# Patient Record
Sex: Female | Born: 1985 | Race: Black or African American | Hispanic: No | Marital: Single | Smoking: Never smoker
Health system: Southern US, Community
[De-identification: ages and names within clinical notes are randomized; demographics above are authoritative.]

---

## 2015-04-25 ENCOUNTER — Emergency Department (HOSPITAL_BASED_OUTPATIENT_CLINIC_OR_DEPARTMENT_OTHER)
Admission: EM | Admit: 2015-04-25 | Discharge: 2015-04-25 | Disposition: A | Discharge status: Home / Self Care | Payer: Self-pay | Attending: Emergency Medicine | Admitting: Emergency Medicine

## 2015-04-25 ENCOUNTER — Encounter (HOSPITAL_BASED_OUTPATIENT_CLINIC_OR_DEPARTMENT_OTHER): Payer: Self-pay

## 2015-04-25 DIAGNOSIS — K088 Other specified disorders of teeth and supporting structures: Secondary | ICD-10-CM | POA: Insufficient documentation

## 2015-04-25 DIAGNOSIS — K0889 Other specified disorders of teeth and supporting structures: Secondary | ICD-10-CM

## 2015-04-25 DIAGNOSIS — M545 Low back pain: Secondary | ICD-10-CM | POA: Insufficient documentation

## 2015-04-25 MED ORDER — NAPROXEN 500 MG PO TABS: 500.0000 mg | ORAL_TABLET | Freq: Two times a day (BID) | ORAL | Status: DC

## 2015-04-25 NOTE — ED Notes (Signed)
Pt c/o left back tooth pain radiating down left neck to left shoulder blade.  Pain so severe in her back that she is unable to walk.

## 2015-04-25 NOTE — ED Provider Notes (Signed)
CSN: [id number]     Arrival date & time 04/25/15  1208 History   First MD Initiated Contact with Patient 04/25/15 1249     Chief Complaint  Patient presents with  . Back Pain  . Dental Pain     (Consider location/radiation/quality/duration/timing/severity/associated sxs/prior Treatment) HPI Comments: Patient presents today with left lower dental pain and also lower back pain.  He states that onset of both symptoms earlier this morning.  Both the dental pain and the back pain have been intermittent.  She denies any pain at this time.  She did not take anything for pain prior to arrival.  Back pain does not radiate.  Pain was worse with movement..  Dental pain is left lower tooth in the area of the wisdom tooth.  No acute injury or trauma to the teeth or the back.  She denies numbness, tingling, bowel/bladder incontinence, fever, chills, abdominal pain, or difficulty swallowing.  She does not have a dentist.  Patient is a 29 y.o. female presenting with back pain and tooth pain. The history is provided by the patient.  Back Pain Dental Pain   History reviewed. No pertinent past medical history. History reviewed. No pertinent past surgical history. No family history on file. History  Substance Use Topics  . Smoking status: Never Smoker   . Smokeless tobacco: Not on file  . Alcohol Use: Not on file   OB History    No data available     Review of Systems  Musculoskeletal: Positive for back pain.  All other systems reviewed and are negative.     Allergies  Review of patient's allergies indicates no known allergies.  Home Medications   Prior to Admission medications   Not on File   BP 123/70 mmHg  Pulse 93  Temp(Src) 97.6 F (36.4 C) (Oral)  Resp 18  SpO2 100% Physical Exam  Constitutional: She appears well-developed and well-nourished.  HENT:  Head: Normocephalic and atraumatic.  Mouth/Throat: Oropharynx is clear and moist and mucous membranes are normal. No trismus  in the jaw. No dental abscesses or dental caries.  No tenderness to palpation of the gingiva.  No sublingual tenderness or swelling.    Neck: Normal range of motion. Neck supple.  Cardiovascular: Normal rate, regular rhythm and normal heart sounds.   Pulmonary/Chest: Effort normal and breath sounds normal.  Abdominal: Soft. There is no tenderness.  Musculoskeletal:       Thoracic back: She exhibits normal range of motion, no tenderness, no bony tenderness, no swelling, no edema and no deformity.       Lumbar back: She exhibits normal range of motion, no tenderness, no bony tenderness, no swelling, no edema and no deformity.  Neurological: She is alert. She has normal strength. No sensory deficit. Gait normal.  Skin: Skin is warm and dry.  Psychiatric: She has a normal mood and affect.  Nursing note and vitals reviewed.   ED Course  Procedures (including critical care time) Labs Review Labs Reviewed - No data to display  Imaging Review No results found.   EKG Interpretation   Date/Time:  Saturday April 25 2015 12:36:01 EDT Ventricular Rate:  94 PR Interval:  122 QRS Duration: 86 QT Interval:  356 QTC Calculation: 445 R Axis:   78 Text Interpretation:  Normal sinus rhythm with sinus arrhythmia  Nonspecific T wave abnormality Abnormal ECG agree. no STEMI Confirmed by  Donnald Garre, MD, Lebron Conners [phone]) on 04/25/2015 12:50:46 PM      MDM   Final  diagnoses:  None   Patient presents today with complaints of lower back pain and also dental pain.  However, at the time of evaluation she denied any pain at all.  She had not taken anything for pain prior to arrival.  She reports that the pain has been intermittent throughout the day.   No gross dental abscess.  Exam unconcerning for Ludwig's angina or spread of infection.   Urged patient to follow-up with dentist.  No neurological deficits and normal neuro exam.  Patient can ambulate without difficulty.  No loss of bowel or bladder control.   No concern for cauda equina.  No fever, night sweats, weight loss, h/o cancer, IVDU.  Patient stale for discharge.  Return precautions given.      Santiago Glad, PA-C 04/27/15 1610  Arby Barrette, MD 04/27/15 [phone]

## 2016-05-18 ENCOUNTER — Ambulatory Visit: Payer: Self-pay

## 2016-06-08 ENCOUNTER — Telehealth: Payer: Self-pay | Admitting: Family

## 2016-06-08 NOTE — Telephone Encounter (Signed)
Got patient scheduled for this Friday.

## 2016-06-08 NOTE — Telephone Encounter (Signed)
Ok to schedule in a 30 minute slot.

## 2016-06-08 NOTE — Telephone Encounter (Signed)
Patient needs to establish care and have CPE before 8/28th.  Patient is requesting to be worked in soon because she is needing a form completed for school.  Is this something Tammy SoursGreg can do?  Note to scheduler: pt is self pay

## 2016-06-10 ENCOUNTER — Ambulatory Visit: Payer: Self-pay | Admitting: Family

## 2016-06-10 DIAGNOSIS — Z7689 Persons encountering health services in other specified circumstances: Secondary | ICD-10-CM

## 2018-11-18 ENCOUNTER — Emergency Department (HOSPITAL_COMMUNITY)
Admission: EM | Admit: 2018-11-18 | Discharge: 2018-11-18 | Disposition: A | Discharge status: Home / Self Care | Payer: Self-pay | Attending: Emergency Medicine | Admitting: Emergency Medicine

## 2018-11-18 ENCOUNTER — Emergency Department (HOSPITAL_COMMUNITY): Payer: Self-pay

## 2018-11-18 ENCOUNTER — Encounter (HOSPITAL_COMMUNITY): Payer: Self-pay | Admitting: Emergency Medicine

## 2018-11-18 DIAGNOSIS — R1031 Right lower quadrant pain: Secondary | ICD-10-CM | POA: Insufficient documentation

## 2018-11-18 DIAGNOSIS — R109 Unspecified abdominal pain: Secondary | ICD-10-CM

## 2018-11-18 DIAGNOSIS — N132 Hydronephrosis with renal and ureteral calculous obstruction: Secondary | ICD-10-CM | POA: Insufficient documentation

## 2018-11-18 DIAGNOSIS — N200 Calculus of kidney: Secondary | ICD-10-CM

## 2018-11-18 LAB — URINALYSIS, ROUTINE W REFLEX MICROSCOPIC
Bilirubin Urine: NEGATIVE
GLUCOSE, UA: 50 mg/dL — AB
Hgb urine dipstick: NEGATIVE
Ketones, ur: 80 mg/dL — AB
Nitrite: NEGATIVE
PH: 8 (ref 5.0–8.0)
Protein, ur: 30 mg/dL — AB
Specific Gravity, Urine: 1.019 (ref 1.005–1.030)

## 2018-11-18 LAB — POC URINE PREG, ED: Preg Test, Ur: NEGATIVE

## 2018-11-18 MED ORDER — HYDROMORPHONE HCL 1 MG/ML IJ SOLN: 0.5000 mg | Freq: Once | INTRAMUSCULAR | Status: DC

## 2018-11-18 MED ORDER — ONDANSETRON 4 MG PO TBDP: 4.0000 mg | ORAL_TABLET | Freq: Three times a day (TID) | ORAL | 0 refills | Status: AC | PRN

## 2018-11-18 MED ORDER — MORPHINE SULFATE (PF) 4 MG/ML IV SOLN
4.0000 mg | Freq: Once | INTRAVENOUS | Status: DC
Start: 2018-11-18 — End: 2018-11-18

## 2018-11-18 MED ORDER — ONDANSETRON 4 MG PO TBDP
4.0000 mg | ORAL_TABLET | Freq: Once | ORAL | Status: AC
  Administered 2018-11-18: 4 mg via ORAL
  Filled 2018-11-18: qty 1

## 2018-11-18 MED ORDER — OXYCODONE-ACETAMINOPHEN 5-325 MG PO TABS
1.0000 | ORAL_TABLET | Freq: Once | ORAL | Status: AC
  Administered 2018-11-18: 1 via ORAL
  Filled 2018-11-18: qty 1

## 2018-11-18 MED ORDER — NAPROXEN 500 MG PO TABS: 500.0000 mg | ORAL_TABLET | Freq: Two times a day (BID) | ORAL | 0 refills | Status: AC | PRN

## 2018-11-18 MED ORDER — HYDROCODONE-ACETAMINOPHEN 5-325 MG PO TABS: 2.0000 | ORAL_TABLET | Freq: Four times a day (QID) | ORAL | 0 refills | Status: AC | PRN

## 2018-11-18 MED ORDER — OXYCODONE-ACETAMINOPHEN 5-325 MG PO TABS
1.0000 | ORAL_TABLET | Freq: Once | ORAL | Status: DC
Start: 2018-11-18 — End: 2018-11-18
  Filled 2018-11-18: qty 1

## 2018-11-18 MED ORDER — ONDANSETRON 4 MG PO TBDP
4.0000 mg | ORAL_TABLET | Freq: Once | ORAL | Status: DC
  Filled 2018-11-18: qty 1

## 2018-11-18 MED ORDER — KETOROLAC TROMETHAMINE 15 MG/ML IJ SOLN: 15.0000 mg | Freq: Once | INTRAMUSCULAR | Status: DC

## 2018-11-18 NOTE — Discharge Instructions (Signed)
It was my pleasure taking care of you today!   You have been diagnosed with a kidney stone. Drink plenty of fluids to help you pass the stone.  Take naproxen as directed with food for mild to moderate pain. Use your pain medication as directed and only as needed for severe pain. Use Zofran for nausea as directed.  Follow up with the urology clinic listed in regards to your hospital visit.   Return to the ED immediately if you develop fever that persists > 101, uncontrolled pain or vomiting, or other concerns.   Do not drink alcohol, drive or participate in any other potentially dangerous activities while taking opiate pain medication as it may make you sleepy. Do not take this medication with any other sedating medications, either prescription or over-the-counter. If you were prescribed Percocet or Vicodin, do not take these with acetaminophen (Tylenol) as it is already contained within these medications.   This medication is an opiate (or narcotic) pain medication and can be habit forming.  Use it as little as possible to achieve adequate pain control.  Do not use or use it with extreme caution if you have a history of opiate abuse or dependence. This medication is intended for your use only - do not give any to anyone else and keep it in a secure place where nobody else, especially children, have access to it. It will also cause or worsen constipation, so you may want to consider taking an over-the-counter stool softener while you are taking this medication.

## 2018-11-18 NOTE — ED Provider Notes (Signed)
[facility]-EMERGENCY DEPT Provider Note   CSN: [id number] Arrival date & time: 11/18/18  0510     History   Chief Complaint Chief Complaint  Patient presents with  . Nausea  . Flank Pain    HPI Tiffany Sosa is a 33 y.o. female.  Patient to ED with sudden onset during the night of right flank pain, nausea and vomiting. No history of kidney stones. No fever. She was asymptomatic during the previous day. No injury or history of back pain. No abdominal pain, CP, SOB.   The history is provided by the patient. No language interpreter was used.  Flank Pain  Pertinent negatives include no abdominal pain.    No past medical history on file.  There are no active problems to display for this patient.   No past surgical history on file.   OB History   No obstetric history on file.      Home Medications    Prior to Admission medications   Medication Sig Start Date End Date Taking? Authorizing Provider  naproxen (NAPROSYN) 500 MG tablet Take 1 tablet (500 mg total) by mouth 2 (two) times daily. 04/25/15   Santiago GladLaisure, Heather, PA-C    Family History No family history on file.  Social History Social History   Tobacco Use  . Smoking status: Never Smoker  Substance Use Topics  . Alcohol use: Not Currently  . Drug use: Not Currently     Allergies   Peanut butter flavor   Review of Systems Review of Systems  Constitutional: Negative for fever.  Gastrointestinal: Positive for nausea and vomiting. Negative for abdominal pain.  Genitourinary: Positive for flank pain.     Physical Exam Updated Vital Signs BP 123/69 (BP Location: Left Arm)   Pulse 77   Temp 98.3 F (36.8 C) (Oral)   Resp 18   Ht 5\' 6"  (1.676 m)   Wt 49.9 kg   LMP 10/17/2018 (Approximate)   SpO2 100%   BMI 17.75 kg/m   Physical Exam Vitals signs and nursing note reviewed.  Constitutional:      Comments: Patient very uncomfortable in appearance.  Cardiovascular:   Rate and Rhythm: Normal rate and regular rhythm.     Heart sounds: No murmur.  Pulmonary:     Effort: Pulmonary effort is normal.     Breath sounds: No wheezing, rhonchi or rales.  Abdominal:     General: Abdomen is flat.     Tenderness: There is no abdominal tenderness.  Genitourinary:    Comments: Right CVA tenderness Skin:    General: Skin is warm and dry.      ED Treatments / Results  Labs (all labs ordered are listed, but only abnormal results are displayed) Labs Reviewed  URINALYSIS, ROUTINE W REFLEX MICROSCOPIC  I-STAT BETA HCG BLOOD, ED ([facility], [facility], [facility] ONLY)  I-STAT CHEM 8, ED    EKG None  Radiology No results found.  Procedures Procedures (including critical care time)  Medications Ordered in ED Medications  HYDROmorphone (DILAUDID) injection 0.5 mg (has no administration in time range)  ketorolac (TORADOL) 15 MG/ML injection 15 mg (has no administration in time range)     Initial Impression / Assessment and Plan / ED Course  I have reviewed the triage vital signs and the nursing notes.  Pertinent labs & imaging results that were available during my care of the patient were reviewed by me and considered in my medical decision making (see chart for details).  Patient to ED with sudden onset right flank pain during the night associated with nausea and vomiting. No fever. No history of kidney stones.   She is unable to lie still on the bed, and is nauseous. She reports having to urinate again. She also expresses phobia of needles. PO medications provided, urine collected for UA and pregnancy. CT pending pregnancy result.   Patient care [facility], PA-C, pending final diagnosis.   Final Clinical Impressions(s) / ED Diagnoses   Final diagnoses:  None   1. Right flank pain  ED Discharge Orders    None       Elpidio AnisUpstill, Tiarra Anastacio, Cordelia Poche-C 11/18/18 [id number]    Mesner, Barbara CowerJason, MD 11/18/18 0730

## 2018-11-18 NOTE — ED Notes (Signed)
Patient not taking her medication even after she request it to be crush and put in apple sauce. Patient refused to open her mouth to take her medication.

## 2018-11-18 NOTE — ED Notes (Signed)
Patient refused bloodwork and IV at this time, will try again later. Patient is afraid of needles.

## 2018-11-18 NOTE — ED Provider Notes (Signed)
Care assumed from previous provider PA Upstill. Please see note for further details. Case discussed, plan agreed upon. Will follow up on urinalysis and pending CT renal study.   Urinalysis without signs of infection. CT does show 2 mm stone in the right ureter with moderate hydro.   Patient is tolerating PO.  Discussed home care instructions, follow-up care and return precautions. All questions answered.   Ward, Chase PicketJaime Pilcher, PA-C 11/18/18 [id number]    Mancel BaleWentz, Elliott, MD 11/18/18 [phone]

## 2018-11-18 NOTE — ED Notes (Signed)
Bed: RF16 Expected date:  Expected time:  Means of arrival:  Comments: 32 yr nausea, vomiting

## 2018-11-18 NOTE — ED Notes (Addendum)
Patient vomited up some of her pain medication. Still refusing IV at this time.

## 2018-11-18 NOTE — ED Triage Notes (Signed)
Arrived via EMS from home. The patient woke up at 3:45 am, had some nausea, dry heaves, lower R flank pain, one episode of vomiting, and intense R flank pain. Patient reports no hx of kidney stones, boyfriend said no medications.   Vitals  -BP 130/70 -P 84 -RR 16

## 2019-03-07 ENCOUNTER — Other Ambulatory Visit: Payer: Self-pay

## 2019-03-07 ENCOUNTER — Emergency Department (HOSPITAL_BASED_OUTPATIENT_CLINIC_OR_DEPARTMENT_OTHER)
Admission: EM | Admit: 2019-03-07 | Discharge: 2019-03-07 | Disposition: A | Discharge status: Home / Self Care | Payer: Self-pay | Attending: Emergency Medicine | Admitting: Emergency Medicine

## 2019-03-07 ENCOUNTER — Encounter (HOSPITAL_BASED_OUTPATIENT_CLINIC_OR_DEPARTMENT_OTHER): Payer: Self-pay | Admitting: Emergency Medicine

## 2019-03-07 DIAGNOSIS — Z91010 Allergy to peanuts: Secondary | ICD-10-CM | POA: Insufficient documentation

## 2019-03-07 DIAGNOSIS — Z79899 Other long term (current) drug therapy: Secondary | ICD-10-CM | POA: Insufficient documentation

## 2019-03-07 DIAGNOSIS — F419 Anxiety disorder, unspecified: Secondary | ICD-10-CM | POA: Insufficient documentation

## 2019-03-07 DIAGNOSIS — R253 Fasciculation: Secondary | ICD-10-CM | POA: Insufficient documentation

## 2019-03-07 NOTE — ED Triage Notes (Signed)
Pt reports muscle twitching in various mauscles for months; denies pain; noted to be tachycardic 115-132 during assesment

## 2019-03-07 NOTE — ED Provider Notes (Signed)
[facility] EMERGENCY DEPARTMENT Provider Note   CSN: [id number] Arrival date & time: 03/07/19  1226    History   Chief Complaint Chief Complaint  Patient presents with  . Spasms    HPI Tiffany Sosa is a 33 y.o. female.     33yo F who p/w muscle twitching.  Patient states that for several months she has had intermittent muscle twitching in her legs and feet.  It occurs randomly.  She was reading on the Internet recently that it could be due to a vitamin deficiency.  She denies any recent illness including no fevers, vomiting, diarrhea, URI symptoms.  She has been eating and drinking normally.  She denies any drug or alcohol use.  No complaints of pain. She has never been evaluated for this before.  The history is provided by the patient.    History reviewed. No pertinent past medical history.  There are no active problems to display for this patient.   History reviewed. No pertinent surgical history.   OB History   No obstetric history on file.      Home Medications    Prior to Admission medications   Medication Sig Start Date End Date Taking? Authorizing Provider  BIOTIN PO Take by mouth.   Yes [provider]  HYDROcodone-acetaminophen (NORCO/VICODIN) 5-325 MG tablet Take 2 tablets by mouth every 6 (six) hours as needed for severe pain. 11/18/18   Ward, Chase Picket, PA-C  naproxen (NAPROSYN) 500 MG tablet Take 1 tablet (500 mg total) by mouth 2 (two) times daily as needed. 11/18/18   Ward, Chase Picket, PA-C  ondansetron (ZOFRAN ODT) 4 MG disintegrating tablet Take 1 tablet (4 mg total) by mouth every 8 (eight) hours as needed for nausea or vomiting. 11/18/18   Ward, Chase Picket, PA-C    Family History No family history on file.  Social History Social History   Tobacco Use  . Smoking status: Never Smoker  . Smokeless tobacco: Never Used  Substance Use Topics  . Alcohol use: Not Currently  . Drug use: Not Currently     Allergies    Peanut butter flavor   Review of Systems Review of Systems All other systems reviewed and are negative except that which was mentioned in HPI   Physical Exam Updated Vital Signs BP (!) 145/98   Pulse (!) 120   Temp 98.4 F (36.9 C) (Oral)   Resp 18   Ht 5\' 6"  (1.676 m)   Wt 52.2 kg   LMP 02/25/2019   SpO2 100%   BMI 18.56 kg/m   Physical Exam Vitals signs and nursing note reviewed.  Constitutional:      General: She is not in acute distress.    Appearance: She is well-developed.  HENT:     Head: Normocephalic and atraumatic.     Nose: Nose normal.     Mouth/Throat:     Mouth: Mucous membranes are moist.     Pharynx: Oropharynx is clear.  Eyes:     Conjunctiva/sclera: Conjunctivae normal.     Pupils: Pupils are equal, round, and reactive to light.  Neck:     Musculoskeletal: Neck supple.  Cardiovascular:     Rate and Rhythm: Regular rhythm. Tachycardia present.     Heart sounds: Normal heart sounds. No murmur.  Pulmonary:     Effort: Pulmonary effort is normal.     Breath sounds: Normal breath sounds.  Abdominal:     General: Bowel sounds are normal. There is no  distension.     Palpations: Abdomen is soft.     Tenderness: There is no abdominal tenderness.  Musculoskeletal:     Right lower leg: No edema.     Left lower leg: No edema.  Skin:    General: Skin is warm and dry.  Neurological:     Mental Status: She is alert and oriented to person, place, and time.     Sensory: No sensory deficit.     Motor: No weakness.     Deep Tendon Reflexes: Reflexes normal.     Comments: Fluent speech No clonus, normal muscle tone  Psychiatric:        Judgment: Judgment normal.     Comments: anxious      ED Treatments / Results  Labs (all labs ordered are listed, but only abnormal results are displayed) Labs Reviewed  BASIC METABOLIC PANEL  CBC    EKG None  Radiology No results found.  Procedures Procedures (including critical care time)  Medications  Ordered in ED Medications - No data to display   Initial Impression / Assessment and Plan / ED Course  I have reviewed the triage vital signs and the nursing notes.  Pertinent labs that were available during my care of the patient were reviewed by me and considered in my medical decision making (see chart for details).        Well appearing, tachycardic but afebrile, no complaints of pain. I suspect some component of anxiety. She has had no infectious symptoms and no vomiting to suggest dehydration. No muscle twitching or abnormal muscle tone on exam.  No suggestion of seizure activity. I ordered CBC and BMP to check for electrolyte derangement. Pt later refused bloodwork, unable to tolerate a blood draw. Discussed the importance of good hydration and recommended follow-up with PCP if symptoms do not improve spontaneously.  She voiced understanding.  Final Clinical Impressions(s) / ED Diagnoses   Final diagnoses:  Muscle twitching    ED Discharge Orders    None       Shaquan Missey, Ambrose Finlandachel Morgan, MD 03/07/19 1400

## 2019-03-07 NOTE — ED Notes (Signed)
Pt sts she is terrified of needles. Pt was repeatedly reassured, but after 10-15 minutes, pt is still unable to even tolerate tourniquet on arm.

## 2020-05-21 IMAGING — CT CT RENAL STONE PROTOCOL
2 of 4 series · 16 of 46 positions shown, 18 images · non-contrast
Comparison: None.

CLINICAL DATA: Abdominal/flank pain

EXAM:
CT ABDOMEN AND PELVIS WITHOUT CONTRAST
TECHNIQUE: Multidetector CT imaging of the abdomen and pelvis was performed
following the standard protocol without oral or IV contrast.

[Series 2: axial st · axial · 0.80mm/px · z∈[+910,+1270]mm · 13 of 80 slices shown, 15 images]
[im 4/80  soft-tissue]
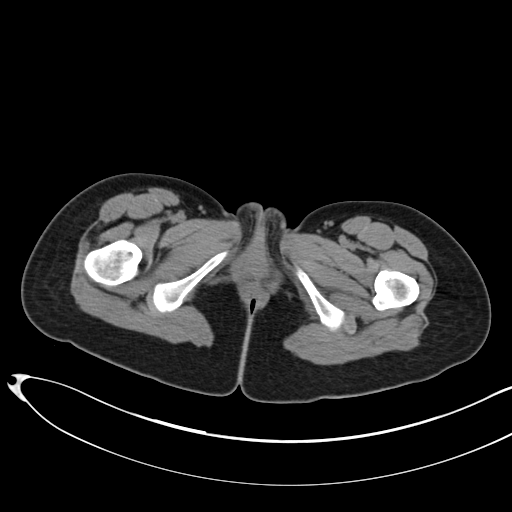
[im 4/80  bone]
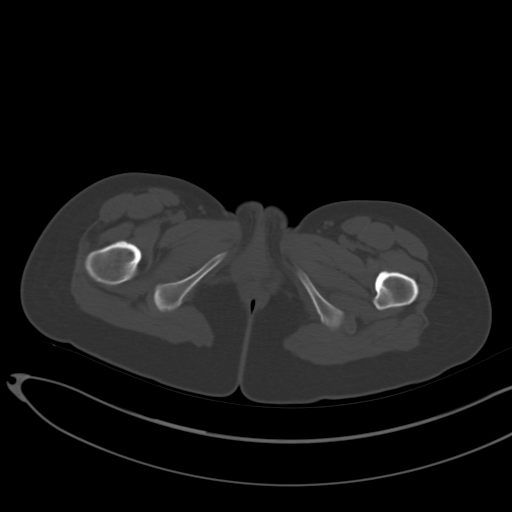
[im 12/80  soft-tissue]
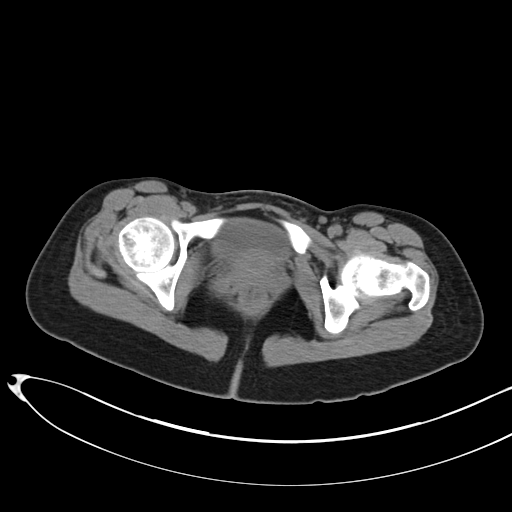
[im 16/80  soft-tissue]
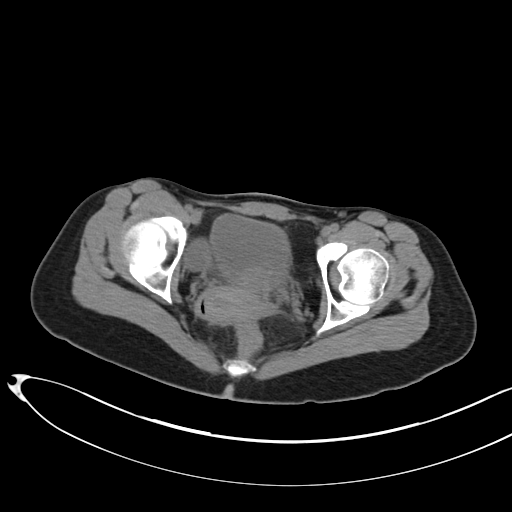
[im 23/80  soft-tissue]
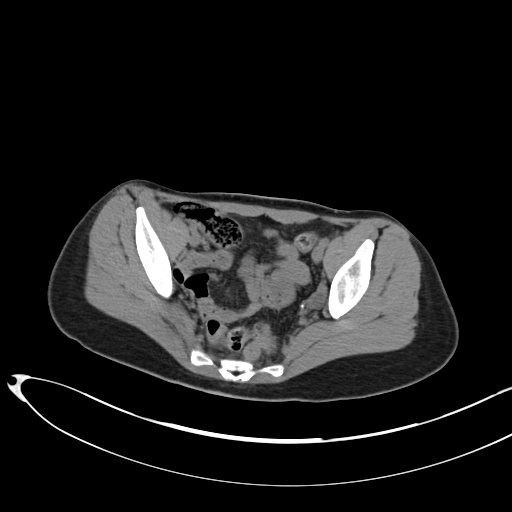
[im 27/80  soft-tissue]
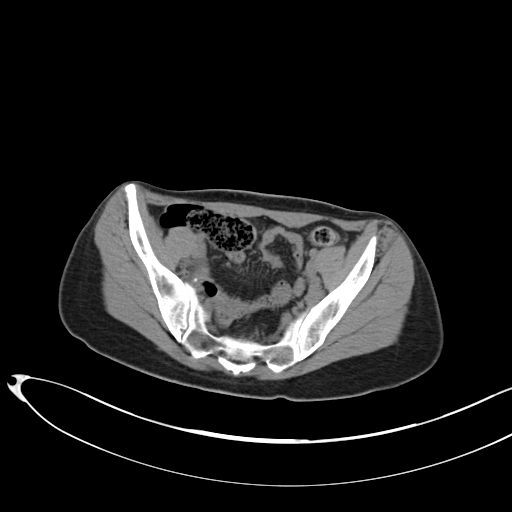
[im 34/80  soft-tissue]
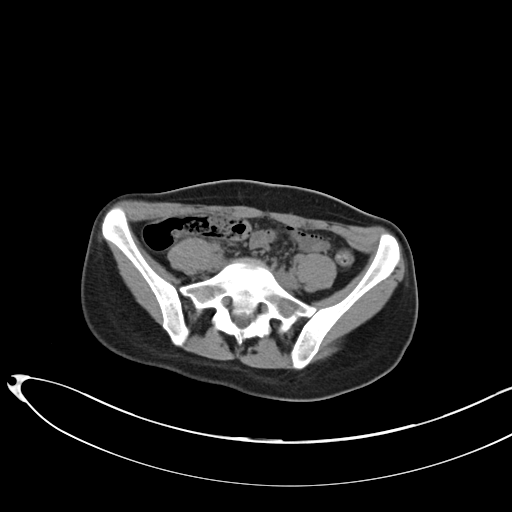
[im 42/80  soft-tissue]
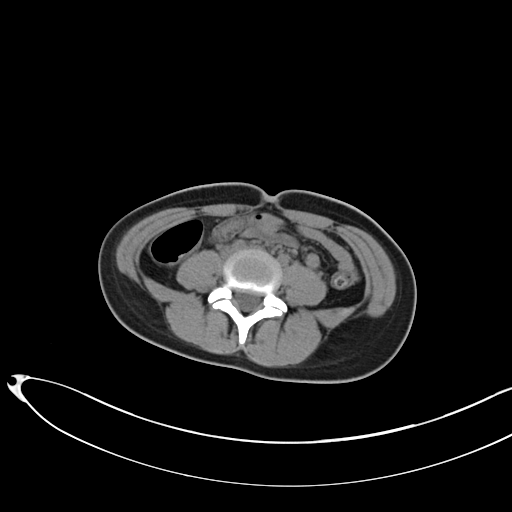
[im 46/80  soft-tissue]
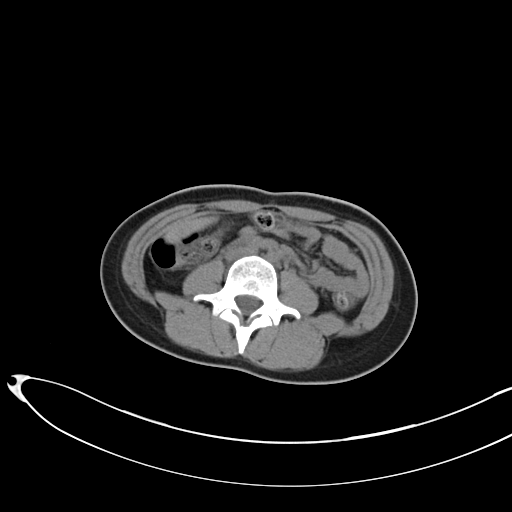
[im 53/80  soft-tissue]
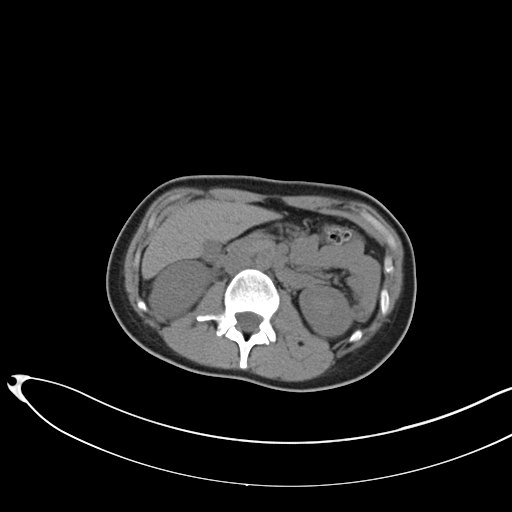
[im 53/80  bone]
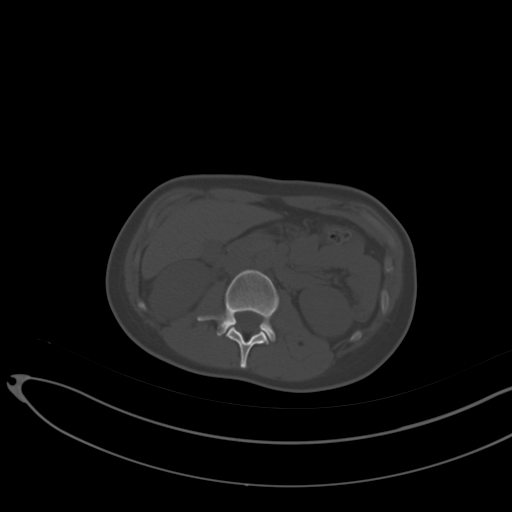
[im 57/80  soft-tissue]
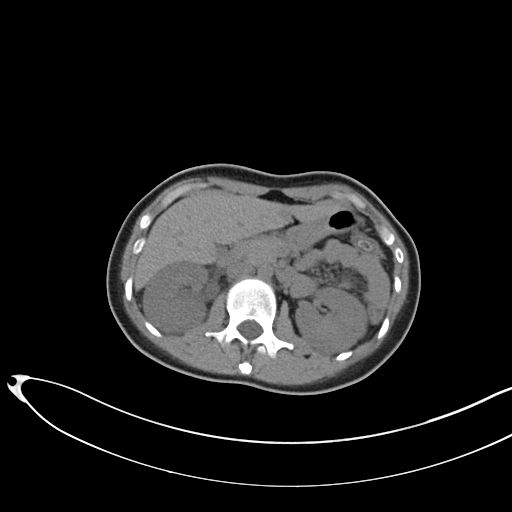
[im 64/80  soft-tissue]
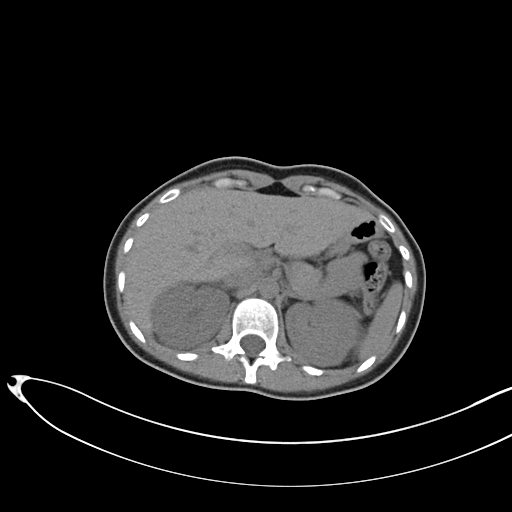
[im 68/80  soft-tissue]
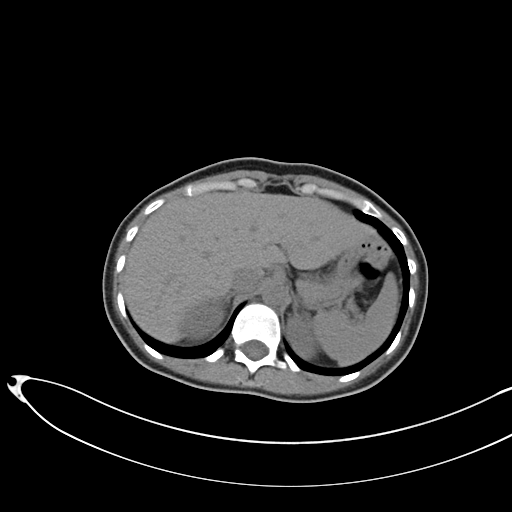
[im 76/80  soft-tissue]
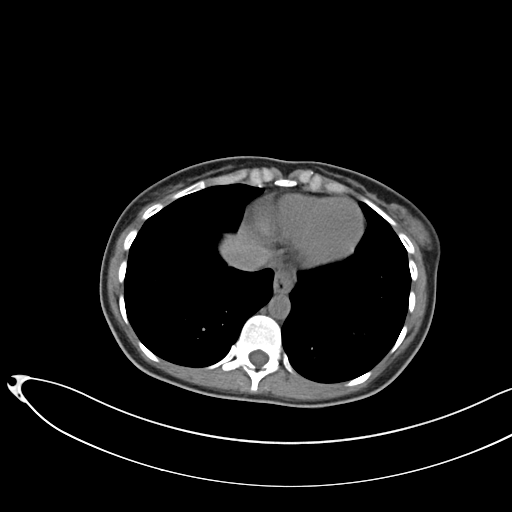

[Series 4: coronal · coronal · 0.67mm/px · 3 of 137 slices shown]
[im 46/137  soft-tissue]
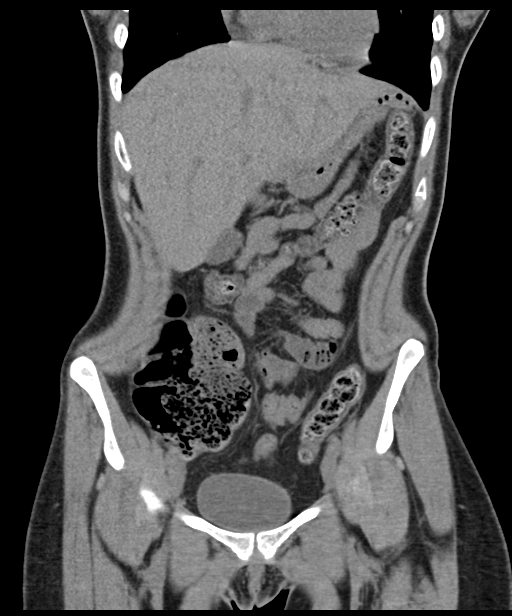
[im 61/137  soft-tissue]
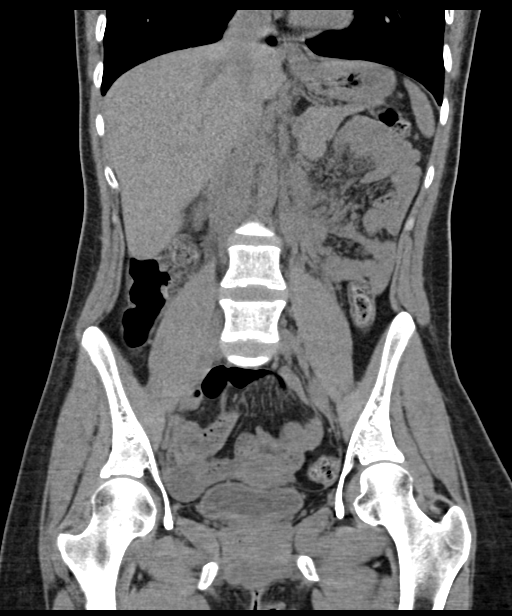
[im 76/137  soft-tissue]
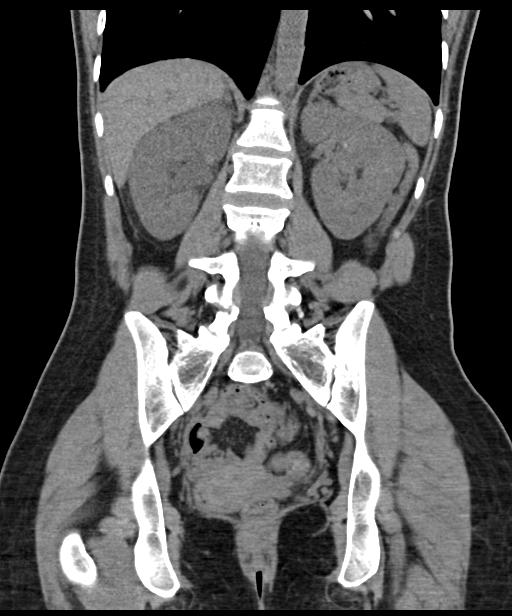

[16 of 46 positions shown; findings below may reference images not displayed]

FINDINGS: Lower chest: Lung bases are clear.

Hepatobiliary: No focal liver lesions are evident on this
noncontrast enhanced study. Gallbladder wall is not appreciably
thickened. There is no biliary duct dilatation.

Pancreas: There is no pancreatic mass or inflammatory focus.

Spleen: No splenic lesions are evident.

Adrenals/Urinary Tract: Adrenals bilaterally appear normal. Right
kidney is somewhat edematous. There is a 9 x 8 mm cyst in the mid
left kidney. There is moderate hydronephrosis on the right. There is
no appreciable hydronephrosis on the left. There is a 2 mm calculus
in the lower pole the right kidney. There is a 1 mm calculus in the
upper pole of the left kidney. There is a 2 mm calculus in the right
ureter at the mid sacral level. No other ureteral calculi are
evident. Urinary bladder is midline with wall thickness within
normal limits.

Stomach/Bowel: There is no appreciable bowel wall or mesenteric
thickening. There is no evident bowel obstruction. There is no free
air or portal venous air.

Vascular/Lymphatic: No abdominal aortic aneurysm. No vascular
lesions are evident on this noncontrast enhanced CT. There is no
adenopathy in the abdomen or pelvis.

Reproductive: The uterus is anteverted. No pelvic masses evident.
There is a small amount of free fluid in the cul-de-sac, primarily
on the right.

Other: The appendix appears normal. There is no abscess in the
abdomen pelvis. There is no ascites beyond mild fluid in the
cul-de-sac.

Musculoskeletal: No there are no blastic or lytic bone lesions. No
intramuscular or abdominal wall lesion evident.
IMPRESSION: 1. 2 mm calculus in the right ureter at the mid sacral level with
moderate hydronephrosis on the right. Right kidney appears
edematous.

2.  Small nonobstructing calculus in each kidney.

3. Small amount of free fluid in the cul-de-sac. Question recent
ovarian cyst rupture. No pelvic mass evident.

4. No evident bowel obstruction. No abscess in the abdomen pelvis.
Appendix appears normal.

## 2023-10-05 ENCOUNTER — Other Ambulatory Visit: Payer: Self-pay

## 2023-10-05 ENCOUNTER — Emergency Department (HOSPITAL_COMMUNITY): Payer: Self-pay

## 2023-10-05 ENCOUNTER — Encounter (HOSPITAL_COMMUNITY): Payer: Self-pay

## 2023-10-05 ENCOUNTER — Emergency Department (HOSPITAL_COMMUNITY)
Admission: EM | Admit: 2023-10-05 | Discharge: 2023-10-06 | Disposition: A | Discharge status: Home / Self Care | Payer: Self-pay | Attending: Emergency Medicine | Admitting: Emergency Medicine

## 2023-10-05 DIAGNOSIS — N200 Calculus of kidney: Secondary | ICD-10-CM | POA: Insufficient documentation

## 2023-10-05 DIAGNOSIS — N23 Unspecified renal colic: Secondary | ICD-10-CM

## 2023-10-05 DIAGNOSIS — Z91010 Allergy to peanuts: Secondary | ICD-10-CM | POA: Insufficient documentation

## 2023-10-05 LAB — URINALYSIS, ROUTINE W REFLEX MICROSCOPIC
Bacteria, UA: NONE SEEN
Bilirubin Urine: NEGATIVE
Glucose, UA: NEGATIVE mg/dL
Ketones, ur: 80 mg/dL — AB
Leukocytes,Ua: NEGATIVE
Nitrite: NEGATIVE
Protein, ur: 100 mg/dL — AB
Specific Gravity, Urine: 1.028 (ref 1.005–1.030)
pH: 5 (ref 5.0–8.0)

## 2023-10-05 LAB — PREGNANCY, URINE: Preg Test, Ur: NEGATIVE

## 2023-10-05 MED ORDER — ONDANSETRON 4 MG PO TBDP
4.0000 mg | ORAL_TABLET | Freq: Once | ORAL | Status: AC
  Administered 2023-10-05: 4 mg via ORAL
  Filled 2023-10-05: qty 1

## 2023-10-05 NOTE — ED Provider Notes (Signed)
EMERGENCY DEPARTMENT AT [facility] Provider Note   CSN: [id number] Arrival date & time: 10/05/23  1806     History  Chief Complaint  Patient presents with   Emesis    Tiffany Sosa is a 37 y.o. female presenting to ED with colicky lower abdominal pain, nausea and vomiting.  Patient reports that she ate a tuna sandwich from [place] 2 nights ago.  When she woke up the following morning, around 5 AM, she was nauseated have several episodes of vomiting.  Subsequently she has been having sharp, sometimes cramping intermittent pains that radiate around from her right flank to her right lower quadrant.  These come and go.  She is not currently having any of the symptoms.  She says she has been tolerating water at home and does not feel significantly nauseated at this time.  She denies that she is on her menstrual cycle.  Denies any history of abdominal surgery.  Does report a history of kidney stone in the past.  HPI     Home Medications Prior to Admission medications   Medication Sig Start Date End Date Taking? Authorizing Provider  ibuprofen (ADVIL) 600 MG tablet Take 1 tablet (600 mg total) by mouth every 6 (six) hours as needed for up to 30 doses for mild pain (pain score 1-3) or moderate pain (pain score 4-6). 10/06/23  Yes Terald Sleeper, MD  ondansetron (ZOFRAN-ODT) 4 MG disintegrating tablet Take 1 tablet (4 mg total) by mouth every 8 (eight) hours as needed for up to 12 doses for vomiting or nausea. 10/06/23  Yes Kory Panjwani, Kermit Balo, MD  oxyCODONE-acetaminophen (PERCOCET/ROXICET) 5-325 MG tablet Take 1 tablet by mouth every 8 (eight) hours as needed for up to 8 doses for severe pain (pain score 7-10). 10/06/23  Yes Terald Sleeper, MD  BIOTIN PO Take by mouth.    [provider]  HYDROcodone-acetaminophen (NORCO/VICODIN) 5-325 MG tablet Take 2 tablets by mouth every 6 (six) hours as needed for severe pain. 11/18/18   Ward, Chase Picket, PA-C  naproxen  (NAPROSYN) 500 MG tablet Take 1 tablet (500 mg total) by mouth 2 (two) times daily as needed. 11/18/18   Ward, Chase Picket, PA-C  ondansetron (ZOFRAN ODT) 4 MG disintegrating tablet Take 1 tablet (4 mg total) by mouth every 8 (eight) hours as needed for nausea or vomiting. 11/18/18   Ward, Chase Picket, PA-C      Allergies    Peanut butter flavoring agent (non-screening)    Review of Systems   Review of Systems  Physical Exam Updated Vital Signs BP 122/86 (BP Location: Left Arm)   Pulse 85   Temp 98.3 F (36.8 C) (Oral)   Resp 16   Ht 5\' 6"  (1.676 m)   Wt 52.2 kg   SpO2 99%   BMI 18.57 kg/m  Physical Exam Constitutional:      General: She is not in acute distress. HENT:     Head: Normocephalic and atraumatic.  Eyes:     Conjunctiva/sclera: Conjunctivae normal.     Pupils: Pupils are equal, round, and reactive to light.  Cardiovascular:     Rate and Rhythm: Normal rate and regular rhythm.  Pulmonary:     Effort: Pulmonary effort is normal. No respiratory distress.  Abdominal:     General: There is no distension.     Tenderness: There is no abdominal tenderness.  Skin:    General: Skin is warm and dry.  Neurological:  General: No focal deficit present.     Mental Status: She is alert. Mental status is at baseline.  Psychiatric:        Mood and Affect: Mood normal.        Behavior: Behavior normal.     ED Results / Procedures / Treatments   Labs (all labs ordered are listed, but only abnormal results are displayed) Labs Reviewed  URINALYSIS, ROUTINE W REFLEX MICROSCOPIC - Abnormal; Notable for the following components:      Result Value   Hgb urine dipstick MODERATE (*)    Ketones, ur 80 (*)    Protein, ur 100 (*)    All other components within normal limits  PREGNANCY, URINE    EKG None  Radiology CT Renal Stone Study  Result Date: 10/05/2023 CLINICAL DATA:  Right-sided flank pain.  Vomiting. EXAM: CT ABDOMEN AND PELVIS WITHOUT CONTRAST TECHNIQUE:  Multidetector CT imaging of the abdomen and pelvis was performed following the standard protocol without IV contrast. RADIATION DOSE REDUCTION: This exam was performed according to the departmental dose-optimization program which includes automated exposure control, adjustment of the mA and/or kV according to patient size and/or use of iterative reconstruction technique. COMPARISON:  11/18/2018. FINDINGS: Lower chest: No acute abnormality. Hepatobiliary: No focal liver abnormality is seen. No gallstones, gallbladder wall thickening, or biliary dilatation. Pancreas: Unremarkable. No pancreatic ductal dilatation or surrounding inflammatory changes. Spleen: Normal in size without focal abnormality. Adrenals/Urinary Tract: The adrenal glands are within normal limits. Hyperdense lesion is noted in the mid left kidney with attenuation of 79 Hounsfield units, likely proteinaceous or hemorrhagic cyst. Renal calculi are noted bilaterally. There is moderate obstructive uropathy on the right with a 4 x 5 mm calculus in the distal right ureter at the UVJ. No obstructive uropathy on the left. The bladder is unremarkable. Stomach/Bowel: Stomach is within normal limits. Appendix is not seen. No evidence of bowel wall thickening, distention, or inflammatory changes. No free air or pneumatosis. Vascular/Lymphatic: No significant vascular findings are present. No enlarged abdominal or pelvic lymph nodes. Reproductive: Uterus and bilateral adnexa are unremarkable. Other: No abdominopelvic ascites. Musculoskeletal: No acute osseous abnormality. IMPRESSION: 1. Moderate obstructive uropathy on the right with a 4 x 5 mm calculus in the distal right ureter at the UVJ. 2. Bilateral nephrolithiasis. Electronically Signed   By: Thornell Sartorius M.D.   On: 10/05/2023 23:59    Procedures Procedures    Medications Ordered in ED Medications  ibuprofen (ADVIL) tablet 800 mg (has no administration in time range)  oxyCODONE-acetaminophen  (PERCOCET/ROXICET) 5-325 MG per tablet 1 tablet (has no administration in time range)  ondansetron (ZOFRAN-ODT) disintegrating tablet 4 mg (4 mg Oral Given 10/05/23 1917)    ED Course/ Medical Decision Making/ A&P Clinical Course as of 10/06/23 0035  Fri Oct 06, 2023  0025 Ureteral stone noted [MT]    Clinical Course User Index [MT] Renaye Rakers Kermit Balo, MD                                 Medical Decision Making Amount and/or Complexity of Data Reviewed Radiology: ordered.  Risk Prescription drug management.   This patient presents to the ED with concern for nausea, vomiting, colicky lower abdominal pain. This involves an extensive number of treatment options, and is a complaint that carries with it a high risk of complications and morbidity.  The differential diagnosis includes foodborne illness versus gastritis versus ureteral colic  versus UTI versus other  She is not focally tender near her gallbladder on exam and does not have a positive Murphy sign.  Additional history obtained from the patient's father at bedside  I ordered imaging studies including CT renal stone study I independently visualized and interpreted imaging which showed 4 to 5 mm right-sided ureteral stone at the UVJ I agree with the radiologist interpretation  I have reviewed the patients home medicines and have made adjustments as needed  Test Considered: Low suspicion for acute ovarian torsion, mesenteric ischemia, AAA, or acute biliary disease.  After the interventions noted above, I reevaluated the patient and found that they have: stayed the same  I explained the stone this should likely pass on its own without requiring intervention.  The patient and her father verbalized understanding.  She was having some worsening pain towards the tail end of her ED stay and was therefore given oral pain medications, which were also prescribed for home.  Patient was able to tolerate p.o. at the time of  discharge  Dispostion:  After consideration of the diagnostic results and the patients response to treatment, I feel that the patent would benefit from outpatient follow-up.         Final Clinical Impression(s) / ED Diagnoses Final diagnoses:  Ureteral colic  Kidney stone    Rx / DC Orders ED Discharge Orders          Ordered    oxyCODONE-acetaminophen (PERCOCET/ROXICET) 5-325 MG tablet  Every 8 hours PRN        10/06/23 0032    ibuprofen (ADVIL) 600 MG tablet  Every 6 hours PRN        10/06/23 0032    ondansetron (ZOFRAN-ODT) 4 MG disintegrating tablet  Every 8 hours PRN        10/06/23 0032              Terald Sleeper, MD 10/06/23 [phone]

## 2023-10-05 NOTE — ED Notes (Signed)
Pt refusing bloodwork at this time

## 2023-10-05 NOTE — ED Triage Notes (Signed)
Patient is here for evaluation of vomiting. Reports on Tuesday night she got a tuna sandwich from [place] and started vomiting around 0500 Wednesday morning. States she has been vomiting since Wednesday. Today she thought she was over the illness, but started vomiting again. Reports abdominal pain at the beginning but no longer has any pain.

## 2023-10-05 NOTE — ED Provider Triage Note (Signed)
Emergency Medicine Provider Triage Evaluation Note  Tiffany Sosa , a 37 y.o. female  was evaluated in triage.  Pt complains of vomiting.  Patient presents to the emergency department concerns of about 2 days of vomiting.  Initially was concern about possible food poisoning as she states that she ate a tuna sandwich a few days ago which caused her symptoms.  She denies any bloody vomit but states that she has had multiple episodes.  Last episode of vomiting was around this morning.  Has not taken medicine for nausea.  Denies any chance of pregnancy.  Review of Systems  Positive: As above Negative: As above  Physical Exam  BP 128/82 (BP Location: Left Arm)   Pulse 88   Temp 98.8 F (37.1 C) (Oral)   Resp 16   Ht 5\' 6"  (1.676 m)   Wt 52.2 kg   SpO2 99%   BMI 18.57 kg/m  Gen:   Awake, no distress   Resp:  Normal effort  MSK:   Moves extremities without difficulty  Other:  No significant tenderness to palpation in the abdomen.  Medical Decision Making  Medically screening exam initiated at 7:05 PM.  Appropriate orders placed.  Tiffany Sosa was informed that the remainder of the evaluation will be completed by another provider, this initial triage assessment does not replace that evaluation, and the importance of remaining in the ED until their evaluation is complete.     Tiffany Knudsen, PA-C 10/05/23 1906

## 2023-10-06 MED ORDER — ONDANSETRON 4 MG PO TBDP: 4.0000 mg | ORAL_TABLET | Freq: Three times a day (TID) | ORAL | 0 refills | Status: AC | PRN

## 2023-10-06 MED ORDER — IBUPROFEN 600 MG PO TABS: 600.0000 mg | ORAL_TABLET | Freq: Four times a day (QID) | ORAL | 0 refills | Status: AC | PRN

## 2023-10-06 MED ORDER — IBUPROFEN 800 MG PO TABS
800.0000 mg | ORAL_TABLET | Freq: Once | ORAL | Status: AC
  Administered 2023-10-06: 800 mg via ORAL
  Filled 2023-10-06: qty 1

## 2023-10-06 MED ORDER — OXYCODONE-ACETAMINOPHEN 5-325 MG PO TABS
1.0000 | ORAL_TABLET | Freq: Once | ORAL | Status: AC
  Administered 2023-10-06: 1 via ORAL
  Filled 2023-10-06: qty 1

## 2023-10-06 MED ORDER — OXYCODONE-ACETAMINOPHEN 5-325 MG PO TABS: 1.0000 | ORAL_TABLET | Freq: Three times a day (TID) | ORAL | 0 refills | Status: AC | PRN

## 2023-10-06 NOTE — Discharge Instructions (Addendum)
Your CT scan shows that you are passing a 4 millimeter kidney stone from your right kidney to the bladder.  This can be quite painful.  A stone the size should likely pass on its own, without needing surgery.  However, if your pain is not improving in 1 week, you can call to make an appointment with a urologist who is a specialist with kidney stones.  Continue drinking plenty of water at home to keep hydrated.  I prescribed you ibuprofen which you can use for mild to moderate pain, and a few Percocet opioid medicines which you can use for severe pain.  Try to take these medicines a little bit of food, and not on an empty stomach.  I also prescribed Zofran which is a dissolving medicine for nausea.
